# Patient Record
Sex: Male | Born: 1965 | Race: White | Hispanic: No | Marital: Married | Smoking: Never smoker
Health system: Southern US, Community
[De-identification: ages and names within clinical notes are randomized; demographics above are authoritative.]

---

## 2001-05-28 ENCOUNTER — Ambulatory Visit (HOSPITAL_COMMUNITY): Admission: RE | Admit: 2001-05-28 | Discharge: 2001-05-28 | Payer: Self-pay | Admitting: Family Medicine

## 2004-05-13 ENCOUNTER — Emergency Department (HOSPITAL_COMMUNITY): Admission: EM | Admit: 2004-05-13 | Discharge: 2004-05-13 | Payer: Self-pay | Admitting: Emergency Medicine

## 2004-05-17 ENCOUNTER — Ambulatory Visit: Payer: Self-pay | Admitting: Orthopedic Surgery

## 2004-05-31 ENCOUNTER — Ambulatory Visit: Payer: Self-pay | Admitting: Orthopedic Surgery

## 2005-05-16 IMAGING — CR DG HAND COMPLETE 3+V*R*
2 series · 2 of 2 positions shown · non-contrast
Comparison: none

CLINICAL DATA: Injury right hand.  
 RIGHT HAND - THREE VIEW:
 Transverse distal 5th metacarpal fracture is noted with mild impaction and volar angulation. The MCP articulation remains intact.  No other fracture is seen.

[view not recorded (1 of 2)]
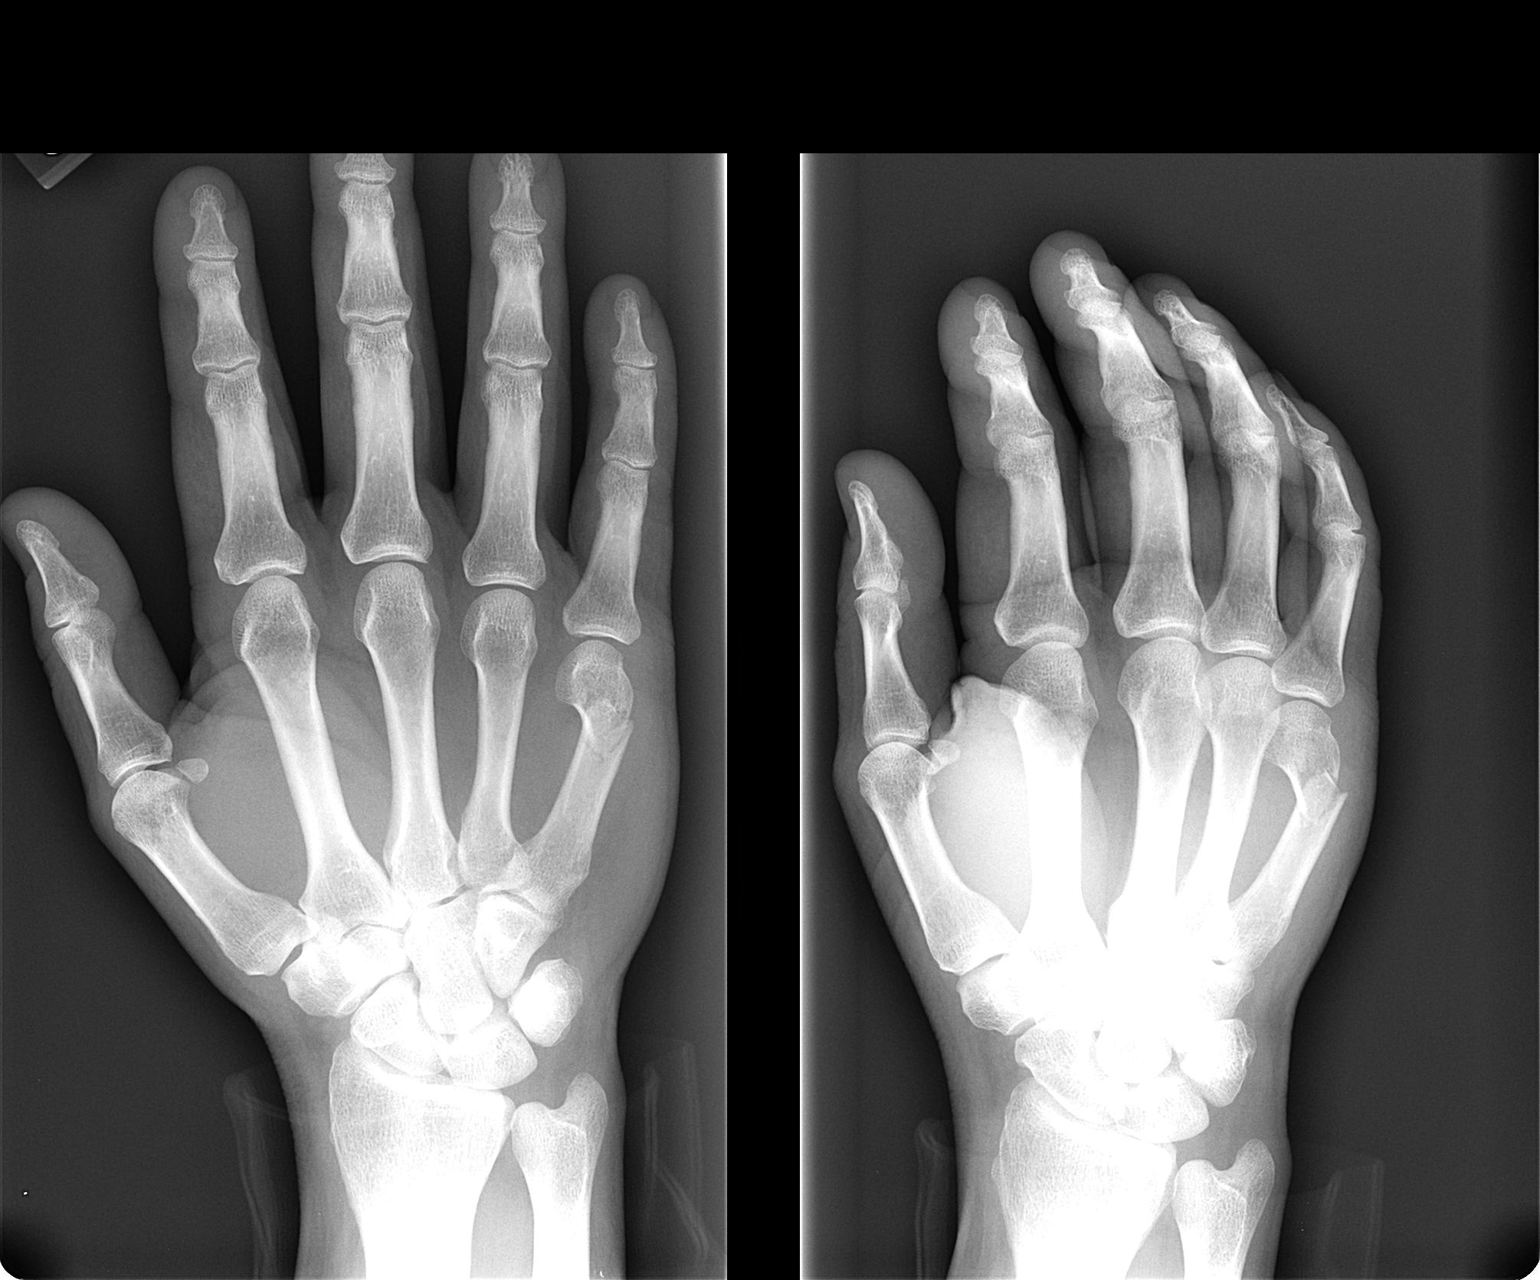

[view not recorded (2 of 2)]
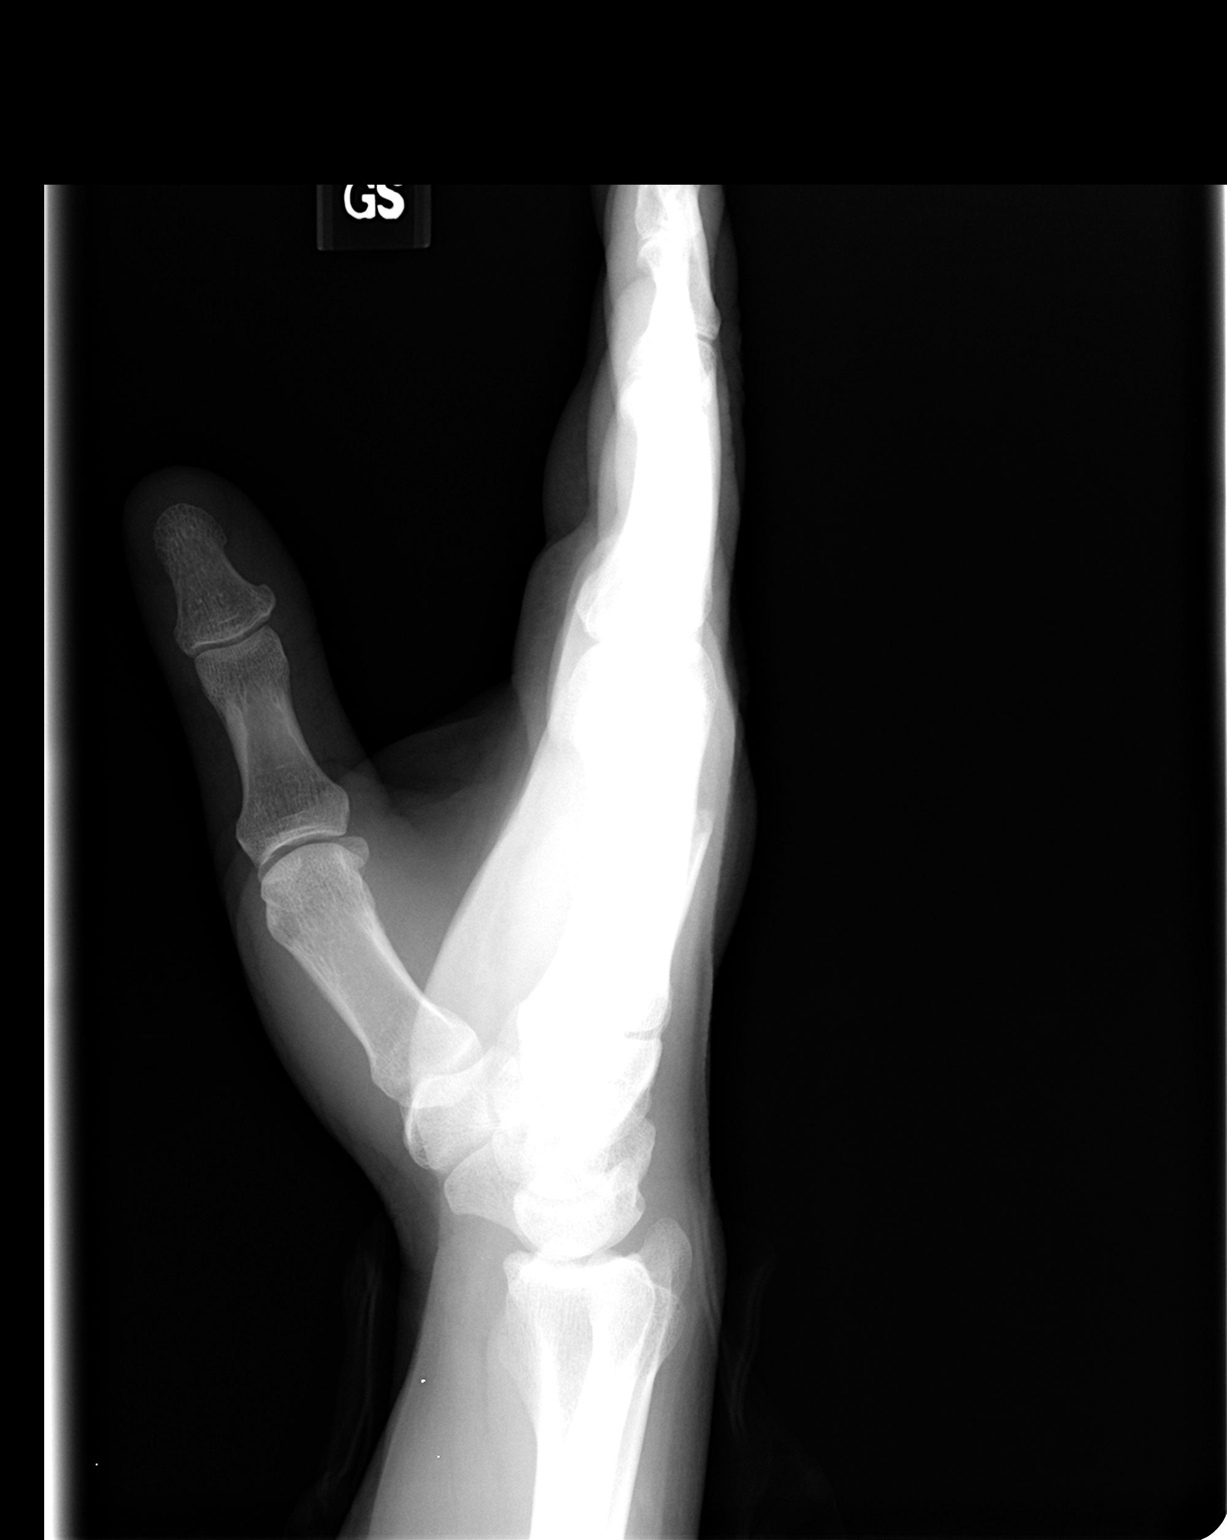

[2 of 2 positions shown; findings below may reference images not displayed]

IMPRESSION: 5TH metacarpal fracture as described above.

## 2011-01-27 ENCOUNTER — Ambulatory Visit: Payer: Self-pay | Admitting: Urgent Care

## 2011-02-23 ENCOUNTER — Ambulatory Visit: Payer: Self-pay | Admitting: Urgent Care

## 2021-01-28 ENCOUNTER — Ambulatory Visit
Admission: EM | Admit: 2021-01-28 | Discharge: 2021-01-28 | Disposition: A | Discharge status: Home / Self Care | Payer: Self-pay | Attending: Emergency Medicine | Admitting: Emergency Medicine

## 2021-01-28 ENCOUNTER — Encounter: Payer: Self-pay | Admitting: Emergency Medicine

## 2021-01-28 ENCOUNTER — Other Ambulatory Visit: Payer: Self-pay

## 2021-01-28 DIAGNOSIS — Z23 Encounter for immunization: Secondary | ICD-10-CM

## 2021-01-28 DIAGNOSIS — S0103XA Puncture wound without foreign body of scalp, initial encounter: Secondary | ICD-10-CM

## 2021-01-28 MED ORDER — TETANUS-DIPHTH-ACELL PERTUSSIS 5-2.5-18.5 LF-MCG/0.5 IM SUSY
0.5000 mL | PREFILLED_SYRINGE | Freq: Once | INTRAMUSCULAR | Status: AC
  Administered 2021-01-28: 0.5 mL via INTRAMUSCULAR

## 2021-01-28 NOTE — Discharge Instructions (Signed)
Wash with warm water and mild soap Tetanus updated Take OTC ibuprofen or tylenol as needed for pain relief Return sooner or go to the ED if you have any new or worsening symptoms such as increased pain, redness, swelling, drainage, discharge, etc..

## 2021-01-28 NOTE — ED Triage Notes (Signed)
Hit head on a nail at work.  Needs a tetanus shot

## 2021-01-28 NOTE — ED Provider Notes (Signed)
  [facility]   [id number] 01/28/21 Arrival Time: 1654  CC: Puncture wound  SUBJECTIVE:  Seth Curry is a 55 y.o. male who presents with a head laceration that occurred today.  Hit head on nail today at work.  Needs tetanus updated.  Bleeding controlled.  Currently not on blood thinners.  Denies similar symptoms in the past.  Denies fever, chills, nausea, vomiting, redness, swelling, purulent drainage.  ROS: As per HPI.  All other pertinent ROS negative.     History reviewed. No pertinent past medical history. History reviewed. No pertinent surgical history. No Known Allergies No current facility-administered medications on file prior to encounter.   No current outpatient medications on file prior to encounter.   Social History   Socioeconomic History   Marital status: Married    Spouse name: Not on file   Number of children: Not on file   Years of education: Not on file   Highest education level: Not on file  Occupational History   Not on file  Tobacco Use   Smoking status: Never   Smokeless tobacco: Never  Substance and Sexual Activity   Alcohol use: Not on file   Drug use: Not on file   Sexual activity: Not on file  Other Topics Concern   Not on file  Social History Narrative   Not on file   Social Determinants of Health   Financial Resource Strain: Not on file  Food Insecurity: Not on file  Transportation Needs: Not on file  Physical Activity: Not on file  Stress: Not on file  Social Connections: Not on file  Intimate Partner Violence: Not on file   History reviewed. No pertinent family history.   OBJECTIVE:  Vitals:   01/28/21 1701  BP: (!) 148/95  Pulse: 79  Resp: 18  Temp: 98.2 F (36.8 C)  TempSrc: Oral  SpO2: 97%     General appearance: alert; no distress Skin: puncture wound/ superficial abrasion to left side of scalp, bleeding controlled, mildly TTP Psychological: alert and cooperative; normal mood and affect  ASSESSMENT  & PLAN:  1. Puncture wound of scalp without foreign body, initial encounter     Meds ordered this encounter  Medications   Tdap (BOOSTRIX) injection 0.5 mL    Wash with warm water and mild soap Tetanus updated Take OTC ibuprofen or tylenol as needed for pain relief Return sooner or go to the ED if you have any new or worsening symptoms such as increased pain, redness, swelling, drainage, discharge, etc..     Reviewed expectations re: course of current medical issues. Questions answered. Outlined signs and symptoms indicating need for more acute intervention. Patient verbalized understanding. After Visit Summary given.    Rennis Harding, PA-C 01/28/21 1712

## 2023-10-27 ENCOUNTER — Encounter: Payer: Self-pay | Admitting: Emergency Medicine

## 2023-10-27 ENCOUNTER — Other Ambulatory Visit: Payer: Self-pay

## 2023-10-27 ENCOUNTER — Ambulatory Visit
Admission: EM | Admit: 2023-10-27 | Discharge: 2023-10-27 | Disposition: A | Discharge status: Home / Self Care | Payer: Self-pay | Attending: Nurse Practitioner | Admitting: Nurse Practitioner

## 2023-10-27 DIAGNOSIS — J0140 Acute pansinusitis, unspecified: Secondary | ICD-10-CM

## 2023-10-27 MED ORDER — AMOXICILLIN-POT CLAVULANATE 875-125 MG PO TABS: 1.0000 | ORAL_TABLET | Freq: Two times a day (BID) | ORAL | 0 refills | Status: AC

## 2023-10-27 NOTE — ED Provider Notes (Signed)
 [facility]    CSN: [id number] Arrival date & time: 10/27/23  1121      History   Chief Complaint Chief Complaint  Patient presents with   Headache    HPI Seth Curry is a 58 y.o. male.   Patient presents today with 5-day history of headache, stuffy nose, sinus pressure, bilateral ear discomfort, dizziness when he bends over, slightly congested cough.  No fever, body aches or chills, chest pain, shortness of breath, abdominal pain, nausea/vomiting, or diarrhea.  Has tried increasing liquids, warm soups without much improvement.  No known sick contacts.     History reviewed. No pertinent past medical history.  There are no active problems to display for this patient.   History reviewed. No pertinent surgical history.     Home Medications    Prior to Admission medications   Medication Sig Start Date End Date Taking? Authorizing Provider  amoxicillin-clavulanate (AUGMENTIN) 875-125 MG tablet Take 1 tablet by mouth 2 (two) times daily for 7 days. 10/27/23 11/03/23 Yes Wilhemena Harbour, NP    Family History History reviewed. No pertinent family history.  Social History Social History   Tobacco Use   Smoking status: Never   Smokeless tobacco: Never     Allergies   Patient has no known allergies.   Review of Systems Review of Systems Per HPI  Physical Exam Triage Vital Signs ED Triage Vitals  Encounter Vitals Group     BP 10/27/23 1147 (!) 151/93     Girls Systolic BP Percentile --      Girls Diastolic BP Percentile --      Boys Systolic BP Percentile --      Boys Diastolic BP Percentile --      Pulse Rate 10/27/23 1147 65     Resp 10/27/23 1147 20     Temp 10/27/23 1147 98.1 F (36.7 C)     Temp Source 10/27/23 1147 Oral     SpO2 10/27/23 1147 96 %     Weight --      Height --      Head Circumference --      Peak Flow --      Pain Score 10/27/23 1145 2     Pain Loc --      Pain Education --      Exclude from Growth Chart --     No data found.  Updated Vital Signs BP (!) 151/93 (BP Location: Right Arm)   Pulse 65   Temp 98.1 F (36.7 C) (Oral)   Resp 20   SpO2 96%   Visual Acuity Right Eye Distance:   Left Eye Distance:   Bilateral Distance:    Right Eye Near:   Left Eye Near:    Bilateral Near:     Physical Exam Vitals and nursing note reviewed.  Constitutional:      General: He is not in acute distress.    Appearance: Normal appearance. He is not ill-appearing or toxic-appearing.  HENT:     Head: Normocephalic and atraumatic.     Right Ear: Tympanic membrane, ear canal and external ear normal.     Left Ear: Tympanic membrane, ear canal and external ear normal.     Nose: No congestion or rhinorrhea.     Right Sinus: Maxillary sinus tenderness and frontal sinus tenderness present.     Left Sinus: Maxillary sinus tenderness and frontal sinus tenderness present.     Mouth/Throat:     Mouth: Mucous membranes  are moist.     Pharynx: Oropharynx is clear. Posterior oropharyngeal erythema present. No oropharyngeal exudate.   Eyes:     General: No scleral icterus.    Extraocular Movements: Extraocular movements intact.    Cardiovascular:     Rate and Rhythm: Normal rate and regular rhythm.  Pulmonary:     Effort: Pulmonary effort is normal. No respiratory distress.     Breath sounds: Normal breath sounds. No wheezing, rhonchi or rales.   Musculoskeletal:     Cervical back: Normal range of motion and neck supple.  Lymphadenopathy:     Cervical: No cervical adenopathy.   Skin:    General: Skin is warm and dry.     Coloration: Skin is not jaundiced or pale.     Findings: No erythema or rash.   Neurological:     Mental Status: He is alert and oriented to person, place, and time.   Psychiatric:        Behavior: Behavior is cooperative.      UC Treatments / Results  Labs (all labs ordered are listed, but only abnormal results are displayed) Labs Reviewed - No data to  display  EKG   Radiology No results found.  Procedures Procedures (including critical care time)  Medications Ordered in UC Medications - No data to display  Initial Impression / Assessment and Plan / UC Course  I have reviewed the triage vital signs and the nursing notes.  Pertinent labs & imaging results that were available during my care of the patient were reviewed by me and considered in my medical decision making (see chart for details).   Patient is mildly hypertensive in triage today, otherwise vital signs are stable.  1. Acute non-recurrent pansinusitis Treat with Augmentin twice daily for 7 days Other supportive care discussed with patient ER and return precautions also discussed  The patient was given the opportunity to ask questions.  All questions answered to their satisfaction.  The patient is in agreement to this plan.   Final Clinical Impressions(s) / UC Diagnoses   Final diagnoses:  Acute non-recurrent pansinusitis     Discharge Instructions      You have a sinus infection.  Take the Augmentin as prescribed to treat it.  Symptoms should improve over the next week to 10 days.  If you develop chest pain or shortness of breath, go to the emergency room.  Some things that can make you feel better are: - Increased rest - Increasing fluid with water/sugar free electrolytes - Acetaminophen and ibuprofen as needed for fever/pain - Salt water gargling, chloraseptic spray and throat lozenges - OTC guaifenesin (Mucinex) 600 mg twice daily for congestion - Saline sinus flushes or a neti pot - Humidifying the air     ED Prescriptions     Medication Sig Dispense Auth. Provider   amoxicillin-clavulanate (AUGMENTIN) 875-125 MG tablet Take 1 tablet by mouth 2 (two) times daily for 7 days. 14 tablet Wilhemena Harbour, NP      PDMP not reviewed this encounter.   Wilhemena Harbour, NP 10/27/23 [phone]

## 2023-10-27 NOTE — Discharge Instructions (Signed)
You have a sinus infection.  Take the Augmentin as prescribed to treat it.  Symptoms should improve over the next week to 10 days.  If you develop chest pain or shortness of breath, go to the emergency room.  Some things that can make you feel better are: - Increased rest - Increasing fluid with water/sugar free electrolytes - Acetaminophen and ibuprofen as needed for fever/pain - Salt water gargling, chloraseptic spray and throat lozenges - OTC guaifenesin (Mucinex) 600 mg twice daily for congestion - Saline sinus flushes or a neti pot - Humidifying the air 

## 2023-10-27 NOTE — ED Triage Notes (Signed)
 Pt reports headache, nasal congestion,facial/eye pressure, bilateral ear discomfort since Monday. Pt reports dizziness with bending over, fatigue for awhile.
